# Patient Record
Sex: Male | Born: 1990 | Race: Black or African American | Hispanic: No | Marital: Married | Smoking: Never smoker
Health system: Southern US, Community
[De-identification: ages and names within clinical notes are randomized; demographics above are authoritative.]

---

## 2017-03-24 ENCOUNTER — Ambulatory Visit
Admission: RE | Admit: 2017-03-24 | Discharge: 2017-03-24 | Disposition: A | Discharge status: Home / Self Care | Payer: No Typology Code available for payment source | Source: Ambulatory Visit | Attending: Internal Medicine | Admitting: Internal Medicine

## 2017-03-24 ENCOUNTER — Other Ambulatory Visit: Payer: Self-pay | Admitting: Internal Medicine

## 2017-03-24 DIAGNOSIS — Z0289 Encounter for other administrative examinations: Secondary | ICD-10-CM

## 2017-11-05 ENCOUNTER — Ambulatory Visit (HOSPITAL_COMMUNITY)
Admission: EM | Admit: 2017-11-05 | Discharge: 2017-11-05 | Disposition: A | Discharge status: Home / Self Care | Payer: Self-pay

## 2017-11-05 ENCOUNTER — Other Ambulatory Visit: Payer: Self-pay

## 2017-11-05 ENCOUNTER — Encounter (HOSPITAL_COMMUNITY): Payer: Self-pay | Admitting: Emergency Medicine

## 2017-11-05 DIAGNOSIS — K625 Hemorrhage of anus and rectum: Secondary | ICD-10-CM

## 2017-11-05 NOTE — Discharge Instructions (Addendum)
Common causes of bleeding from the rectum include hemorrhoids or anal fissure- neither of these were seen on exam today.   Occasional bleeding with wiping is okay, as long as it isn't persistent or seen in the stool.   I would continue to increase fiber in your diet along with fluid intake in order to stay regular and so you do not have to strain.   You may try a sitz bath- this is typically for hemorrhoids or anal fissures.  Please return if bleeding occurring more frequently, develop pain, dizziness, light headedness, increase in amount of blood.

## 2017-11-05 NOTE — ED Triage Notes (Signed)
Pt reports rectal bleeding and abdominal pain three times in the last three months.  He saw a doctor and had an xray done and they determined he was constipated at that time.  He states this happened again on Christmas Day. He only sees the blood when wiping.

## 2017-11-09 NOTE — ED Provider Notes (Signed)
[facility]    CSN: [id number] Arrival date & time: 11/05/17  1435     History   Chief Complaint Chief Complaint  Patient presents with  . Rectal Bleeding  . Abdominal Pain    HPI Julian Owens is a 26 y.o. male presenting with concern over occasional rectal bleeding. States this has occurred about 3 times in the past few months. Most recently on christmas. He states it only happened once and bleeding is only when wiping. Not mixed in stool. No pain. Denies history of familial colon cancer, polyps. Denies straining with bowels. [place] stool chart type 4. Recently was advised increasing fiber and laxative by his primary care, stopped doing this as he felt it didn't do much of anything.    HPI  History reviewed. No pertinent past medical history.  There are no active problems to display for this patient.   History reviewed. No pertinent surgical history.     Home Medications    Prior to Admission medications   Not on File    Family History History reviewed. No pertinent family history.  Social History Social History   Tobacco Use  . Smoking status: Never Smoker  . Smokeless tobacco: Never Used  Substance Use Topics  . Alcohol use: No    Frequency: Never  . Drug use: No     Allergies   Patient has no known allergies.   Review of Systems Review of Systems  Constitutional: Negative for activity change, appetite change, fatigue and fever.  Respiratory: Negative for shortness of breath.   Cardiovascular: Negative for chest pain.  Gastrointestinal: Positive for anal bleeding. Negative for abdominal pain, blood in stool, nausea, rectal pain and vomiting.  Musculoskeletal: Negative for myalgias.  Neurological: Negative for dizziness, syncope, weakness, light-headedness and headaches.     Physical Exam Triage Vital Signs ED Triage Vitals [11/05/17 1511]  Enc Vitals Group     BP 140/87     Pulse Rate 62     Resp      Temp 98 F (36.7  C)     Temp Source Oral     SpO2 100 %     Weight      Height      Head Circumference      Peak Flow      Pain Score      Pain Loc      Pain Edu?      Excl. in GC?    No data found.  Updated Vital Signs BP 140/87 (BP Location: Left Arm)   Pulse 62   Temp 98 F (36.7 C) (Oral)   SpO2 100%    Physical Exam  Constitutional: He appears well-developed and well-nourished.  HENT:  Head: Normocephalic and atraumatic.  Eyes: Conjunctivae are normal.  Neck: Neck supple.  Cardiovascular: Normal rate and regular rhythm.  No murmur heard. Pulmonary/Chest: Effort normal and breath sounds normal. No respiratory distress.  Abdominal: Soft. There is no tenderness.  Genitourinary: Rectum normal. Rectal exam shows no tenderness.  Genitourinary Comments: No external hemorrhoids or anal fissure visualized, no palpation of internal hemorrhoids.   Musculoskeletal: He exhibits no edema.  Neurological: He is alert.  Skin: Skin is warm and dry.  Psychiatric: He has a normal mood and affect.  Nursing note and vitals reviewed.    UC Treatments / Results  Labs (all labs ordered are listed, but only abnormal results are displayed) Labs Reviewed - No data to display  EKG  EKG  Interpretation None       Radiology No results found.  Procedures Procedures (including critical care time)  Medications Ordered in UC Medications - No data to display   Initial Impression / Assessment and Plan / UC Course  I have reviewed the triage vital signs and the nursing notes.  Pertinent labs & imaging results that were available during my care of the patient were reviewed by me and considered in my medical decision making (see chart for details).     Occasional bleeding unlikely from colorectal polyps/cancer given age and lack of family history. No source of bleeding visualized or felt on rectal exam. Lacking pain, unlikely external hemorrhoids or anal fissure. Advised to continue increasing  fiber and water intake. Advised if experiencing occasional bleeding, this is okay as long as it doesn't persist or he notices it in the bowels.   Discussed return precautions. Patient verbalized understanding and is agreeable with plan.   Final Clinical Impressions(s) / UC Diagnoses   Final diagnoses:  Rectal bleeding    ED Discharge Orders    None       Controlled Substance Prescriptions [place] Controlled Substance Registry consulted? Not Applicable   Lew DawesWieters, Hallie C, [place] 11/09/17 1003

## 2019-06-22 ENCOUNTER — Emergency Department (HOSPITAL_BASED_OUTPATIENT_CLINIC_OR_DEPARTMENT_OTHER): Payer: Self-pay

## 2019-06-22 ENCOUNTER — Encounter (HOSPITAL_BASED_OUTPATIENT_CLINIC_OR_DEPARTMENT_OTHER): Payer: Self-pay

## 2019-06-22 ENCOUNTER — Other Ambulatory Visit: Payer: Self-pay

## 2019-06-22 ENCOUNTER — Emergency Department (HOSPITAL_BASED_OUTPATIENT_CLINIC_OR_DEPARTMENT_OTHER)
Admission: EM | Admit: 2019-06-22 | Discharge: 2019-06-22 | Disposition: A | Discharge status: Home / Self Care | Payer: Self-pay | Attending: Emergency Medicine | Admitting: Emergency Medicine

## 2019-06-22 DIAGNOSIS — R202 Paresthesia of skin: Secondary | ICD-10-CM | POA: Insufficient documentation

## 2019-06-22 DIAGNOSIS — G43909 Migraine, unspecified, not intractable, without status migrainosus: Secondary | ICD-10-CM | POA: Insufficient documentation

## 2019-06-22 LAB — CBC WITH DIFFERENTIAL/PLATELET
Abs Immature Granulocytes: 0.02 10*3/uL (ref 0.00–0.07)
Basophils Absolute: 0 10*3/uL (ref 0.0–0.1)
Basophils Relative: 0 %
Eosinophils Absolute: 0.1 10*3/uL (ref 0.0–0.5)
Eosinophils Relative: 1 %
HCT: 45.9 % (ref 39.0–52.0)
Hemoglobin: 14.6 g/dL (ref 13.0–17.0)
Immature Granulocytes: 0 %
Lymphocytes Relative: 41 %
Lymphs Abs: 3.1 10*3/uL (ref 0.7–4.0)
MCH: 28.9 pg (ref 26.0–34.0)
MCHC: 31.8 g/dL (ref 30.0–36.0)
MCV: 90.7 fL (ref 80.0–100.0)
Monocytes Absolute: 0.8 10*3/uL (ref 0.1–1.0)
Monocytes Relative: 10 %
Neutro Abs: 3.6 10*3/uL (ref 1.7–7.7)
Neutrophils Relative %: 48 %
Platelets: 254 10*3/uL (ref 150–400)
RBC: 5.06 MIL/uL (ref 4.22–5.81)
RDW: 11.9 % (ref 11.5–15.5)
WBC: 7.6 10*3/uL (ref 4.0–10.5)
nRBC: 0 % (ref 0.0–0.2)

## 2019-06-22 LAB — BASIC METABOLIC PANEL
Anion gap: 9 (ref 5–15)
BUN: 15 mg/dL (ref 6–20)
CO2: 27 mmol/L (ref 22–32)
Calcium: 8.9 mg/dL (ref 8.9–10.3)
Chloride: 101 mmol/L (ref 98–111)
Creatinine, Ser: 0.83 mg/dL (ref 0.61–1.24)
GFR calc Af Amer: >60 mL/min (ref >60)
GFR calc non Af Amer: >60 mL/min (ref >60)
Glucose, Bld: 101 mg/dL — ABNORMAL HIGH (ref 70–99)
Potassium: 4.4 mmol/L (ref 3.5–5.1)
Sodium: 137 mmol/L (ref 135–145)

## 2019-06-22 MED ORDER — METOCLOPRAMIDE HCL 5 MG/ML IJ SOLN
10.0000 mg | Freq: Once | INTRAMUSCULAR | Status: AC
  Administered 2019-06-22: 10 mg via INTRAVENOUS
  Filled 2019-06-22: qty 2

## 2019-06-22 MED ORDER — SODIUM CHLORIDE 0.9 % IV SOLN: INTRAVENOUS | Status: DC

## 2019-06-22 MED ORDER — SODIUM CHLORIDE 0.9 % IV BOLUS
1000.0000 mL | Freq: Once | INTRAVENOUS | Status: AC
  Administered 2019-06-22: 1000 mL via INTRAVENOUS

## 2019-06-22 MED ORDER — DEXAMETHASONE SODIUM PHOSPHATE 10 MG/ML IJ SOLN
10.0000 mg | Freq: Once | INTRAMUSCULAR | Status: AC
  Administered 2019-06-22: 21:00:00 10 mg via INTRAVENOUS
  Filled 2019-06-22: qty 1

## 2019-06-22 MED ORDER — DIPHENHYDRAMINE HCL 50 MG/ML IJ SOLN
25.0000 mg | Freq: Once | INTRAMUSCULAR | Status: AC
  Administered 2019-06-22: 21:00:00 25 mg via INTRAVENOUS
  Filled 2019-06-22: qty 1

## 2019-06-22 NOTE — ED Notes (Signed)
ED Provider at bedside. 

## 2019-06-22 NOTE — Discharge Instructions (Signed)
Work-up in symptoms seem to be consistent with migraine headache.  Follow-up with low [facility] neurology.  Follow-up with your wife's primary care doctor if they are able to add you on.  Rest tomorrow.  Work note provided.  Head CT today was negative.  Return for any new or worse symptoms.

## 2019-06-22 NOTE — ED Triage Notes (Signed)
Pt c/o HA that started 1 hour PTA. Pt has associated blurred vision and lightheadedness. Pt states he had a brief period of tingling on his L side, but that has resolved.

## 2019-06-22 NOTE — ED Provider Notes (Signed)
[facility] EMERGENCY DEPARTMENT Provider Note   CSN: [id number] Arrival date & time: 06/22/19  1853     History   Chief Complaint Chief Complaint  Patient presents with  . Headache    HPI Julian Owens is a 28 y.o. male.     Patient at 1730 with acute onset of a frontal headache some bilateral blurred vision and some left-sided numbness.  Patient still has the headache but the numbness and blurred vision has resolved.  Patient had something similar happen about a year ago.  Patient thinks it is due to high blood pressure.  No family history of no known history of migraines.  The pain is in the frontal part of the head and that is where it was a year ago.  Patient also a year ago did have a visual changes.  And did have some the numbness.  Is also had this happen prior to that as well.  But no formal diagnosis of migraines.  No fevers no chest pain no shortness of breath no upper respiratory infection symptoms.  Patient was at work when this occurred.     History reviewed. No pertinent past medical history.  There are no active problems to display for this patient.   History reviewed. No pertinent surgical history.      Home Medications    Prior to Admission medications   Not on File    Family History No family history on file.  Social History Social History   Tobacco Use  . Smoking status: Never Smoker  . Smokeless tobacco: Never Used  Substance Use Topics  . Alcohol use: No    Frequency: Never  . Drug use: No     Allergies   Patient has no known allergies.   Review of Systems Review of Systems  Constitutional: Negative for chills and fever.  HENT: Negative for congestion, rhinorrhea and sore throat.   Eyes: Positive for visual disturbance.  Respiratory: Negative for cough and shortness of breath.   Cardiovascular: Negative for chest pain and leg swelling.  Gastrointestinal: Negative for abdominal pain, diarrhea, nausea and vomiting.   Genitourinary: Negative for dysuria.  Musculoskeletal: Negative for back pain and neck pain.  Skin: Negative for rash.  Neurological: Positive for numbness and headaches. Negative for dizziness, weakness and light-headedness.  Hematological: Does not bruise/bleed easily.  Psychiatric/Behavioral: Negative for confusion.     Physical Exam Updated Vital Signs BP 113/80   Pulse (!) 57   Temp 98.8 F (37.1 C) (Oral)   Resp 19   Ht 1.905 m (6\' 3" )   Wt 90.7 kg   SpO2 99%   BMI 25.00 kg/m   Physical Exam Vitals signs and nursing note reviewed.  Constitutional:      Appearance: Normal appearance. He is well-developed. He is not toxic-appearing.  HENT:     Head: Normocephalic and atraumatic.  Eyes:     Extraocular Movements: Extraocular movements intact.     Conjunctiva/sclera: Conjunctivae normal.     Pupils: Pupils are equal, round, and reactive to light.  Neck:     Musculoskeletal: Normal range of motion and neck supple. No neck rigidity or muscular tenderness.  Cardiovascular:     Rate and Rhythm: Normal rate and regular rhythm.     Heart sounds: No murmur.  Pulmonary:     Effort: Pulmonary effort is normal. No respiratory distress.     Breath sounds: Normal breath sounds.  Abdominal:     Palpations: Abdomen is soft.  Tenderness: There is no abdominal tenderness.  Musculoskeletal: Normal range of motion.  Skin:    General: Skin is warm and dry.  Neurological:     General: No focal deficit present.     Mental Status: He is alert and oriented to person, place, and time.     Cranial Nerves: No cranial nerve deficit.     Sensory: No sensory deficit.     Motor: No weakness.      ED Treatments / Results  Labs (all labs ordered are listed, but only abnormal results are displayed) Labs Reviewed  BASIC METABOLIC PANEL - Abnormal; Notable for the following components:      Result Value   Glucose, Bld 101 (*)    All other components within normal limits  CBC WITH  DIFFERENTIAL/PLATELET    EKG EKG Interpretation  Date/Time:  Wednesday June 22 2019 21:18:52 EDT Ventricular Rate:  53 PR Interval:    QRS Duration: 105 QT Interval:  405 QTC Calculation: 381 R Axis:   -12 Text Interpretation:  Sinus rhythm Early repolarization No previous ECGs available Confirmed by Fredia Sorrow [phone]) on 06/22/2019 9:24:10 PM   Radiology Ct Head Wo Contrast  Result Date: 06/22/2019 CLINICAL DATA:  Headache EXAM: CT HEAD WITHOUT CONTRAST TECHNIQUE: Contiguous axial images were obtained from the base of the skull through the vertex without intravenous contrast. COMPARISON:  None. FINDINGS: Brain: No evidence of acute infarction, hemorrhage, hydrocephalus, extra-axial collection or mass lesion/mass effect. Vascular: No hyperdense vessel or unexpected calcification. Skull: Normal. Negative for fracture or focal lesion. Sinuses/Orbits: The visualized paranasal sinuses are essentially clear. The mastoid air cells are unopacified. Other: None. IMPRESSION: Normal head CT. Electronically Signed   By: Julian Hy M.D.   On: 06/22/2019 21:20    Procedures Procedures (including critical care time)  Medications Ordered in ED Medications  0.9 %  sodium chloride infusion (has no administration in time range)  sodium chloride 0.9 % bolus 1,000 mL ( Intravenous Stopped 06/22/19 2226)  dexamethasone (DECADRON) injection 10 mg (10 mg Intravenous Given 06/22/19 2126)  diphenhydrAMINE (BENADRYL) injection 25 mg (25 mg Intravenous Given 06/22/19 2122)  metoCLOPramide (REGLAN) injection 10 mg (10 mg Intravenous Given 06/22/19 2124)     Initial Impression / Assessment and Plan / ED Course  I have reviewed the triage vital signs and the nursing notes.  Pertinent labs & imaging results that were available during my care of the patient were reviewed by me and considered in my medical decision making (see chart for details).       [place] patient symptoms very suggestive of  migraine since is been a recurrent thing last episode was a year ago.  And the pattern is been identical.  With the frontal headache pain blurred vision and numbness.  Head CT negative.  Patient treated with migraine cocktail to include Decadron Benadryl and Reglan.  Patient improved significantly with fluids and these treatments.  I feel that symptoms are consistent with migraine.  With a migraine or the headache improved patient's blood pressure also went back to normal.  Will be referred to neurology.  And also will probably follow-up with his wife's primary care doctor.  Work note provided.    Final Clinical Impressions(s) / ED Diagnoses   Final diagnoses:  Migraine without status migrainosus, not intractable, unspecified migraine type    ED Discharge Orders    None       Fredia Sorrow, MD 06/23/19 [id number]

## 2019-06-22 NOTE — ED Notes (Signed)
ED Provider at bedside. PT to be discharged

## 2019-06-22 NOTE — ED Notes (Signed)
PT wanted to leave AMA. Informed MD. When I went to have pt sign AMA he changed his mind and said he would stay.

## 2019-08-23 ENCOUNTER — Emergency Department (HOSPITAL_BASED_OUTPATIENT_CLINIC_OR_DEPARTMENT_OTHER)
Admission: EM | Admit: 2019-08-23 | Discharge: 2019-08-24 | Disposition: A | Discharge status: Home / Self Care | Payer: Self-pay | Attending: Emergency Medicine | Admitting: Emergency Medicine

## 2019-08-23 ENCOUNTER — Other Ambulatory Visit: Payer: Self-pay

## 2019-08-23 ENCOUNTER — Encounter (HOSPITAL_BASED_OUTPATIENT_CLINIC_OR_DEPARTMENT_OTHER): Payer: Self-pay | Admitting: *Deleted

## 2019-08-23 DIAGNOSIS — G510 Bell's palsy: Secondary | ICD-10-CM | POA: Insufficient documentation

## 2019-08-23 MED ORDER — PREDNISONE 50 MG PO TABS
60.0000 mg | ORAL_TABLET | Freq: Once | ORAL | Status: AC
  Administered 2019-08-24: 60 mg via ORAL
  Filled 2019-08-23: qty 1

## 2019-08-23 NOTE — ED Triage Notes (Addendum)
Pt c/o facial droop starting at 5 pm to day , h/a all day. Lost of taste this pm .

## 2019-08-23 NOTE — ED Notes (Signed)
ED Provider at bedside. 

## 2019-08-24 ENCOUNTER — Other Ambulatory Visit: Payer: Self-pay

## 2019-08-24 MED ORDER — HYPROMELLOSE (GONIOSCOPIC) 2.5 % OP SOLN: 1.0000 [drp] | Freq: Three times a day (TID) | OPHTHALMIC | 12 refills | Status: AC | PRN

## 2019-08-24 MED ORDER — PREDNISONE 50 MG PO TABS: ORAL_TABLET | ORAL | 0 refills | Status: AC

## 2019-08-24 NOTE — ED Provider Notes (Signed)
[facility] EM[facility]ENC[facility]PARTMENT Provider Note   CSN: [id number] Arrival date & time: 08/23/19  2345     History   Chief Complaint Chief Complaint  Patient presents with  . Facial Droop    HPI Julian Owens is a 28 y.o. male.     The history is provided by the patient and the spouse.  Neurologic Problem This is a new problem. The current episode started 6 to 12 hours ago. The problem occurs constantly. The problem has not changed since onset.Associated symptoms include headaches. Pertinent negatives include no chest pain, no abdominal pain and no shortness of breath. Nothing aggravates the symptoms. Nothing relieves the symptoms. He has tried nothing for the symptoms.  Patient reports that approximately 5 PM on October 13 he had right-sided facial weakness.  He reports loss of taste on that side.  Also reports mild weakness of the forehead.  He did have mild headache that is improving.  No arm or Leg weakness.  No speech difficulty No history of stroke.  Previous history of migraines. No history of hearing changes  PMH=migraines Home Medications    Prior to Admission medications   Medication Sig Start Date End Date Taking? Authorizing Provider  hydroxypropyl methylcellulose / hypromellose (ISOPTO TEARS / GONIOVISC) 2.5 % ophthalmic solution Place 1 drop into the right eye 3 (three) times daily as needed for dry eyes. 08/24/19   Zadie Rhine, MD  predniSONE (DELTASONE) 50 MG tablet 1 tablet PO QD X6 days 08/24/19   Zadie Rhine, MD    Family History History reviewed. No pertinent family history.  Social History Social History   Tobacco Use  . Smoking status: Never Smoker  . Smokeless tobacco: Never Used  Substance Use Topics  . Alcohol use: No    Frequency: Never  . Drug use: No     Allergies   Patient has no known allergies.   Review of Systems Review of Systems  Constitutional: Negative for fever.  HENT: Negative for hearing loss.         Change in taste  Eyes: Negative for visual disturbance.  Respiratory: Negative for shortness of breath.   Cardiovascular: Negative for chest pain.  Gastrointestinal: Negative for abdominal pain.  Neurological: Positive for facial asymmetry and headaches. Negative for dizziness, speech difficulty and numbness.  All other systems reviewed and are negative.    Physical Exam Updated Vital Signs BP (!) 144/95   Pulse 76   Temp 98.2 F (36.8 C)   Resp 16   Ht 1.905 m (6\' 3" )   Wt 99.8 kg   SpO2 99%   BMI 27.50 kg/m   Physical Exam  CONSTITUTIONAL: Well developed/well nourished HEAD: Normocephalic/atraumatic EYES: EOMI/PERRL, no nystagmus, no visual field deficit, pt with difficulty keeping right eyelid closed ENMT: Mucous membranes moist, bilateral TMs clear/intact, no rash NECK: supple no meningeal signs, no bruits CV: S1/S2 noted, no murmurs/rubs/gallops noted LUNGS: Lungs are clear to auscultation bilaterally, no apparent distress ABDOMEN: soft, nontender NEURO:Awake/alert, mild right facial droop, no arm or leg drift is noted Equal 5/5 strength with shoulder abduction, elbow flex/extension, wrist flex/extension in upper extremities and equal hand grips bilaterally Equal 5/5 strength with hip flexion,knee flex/extension, foot dorsi/plantar flexion Cranial nerves 3/4/5/04/18/09/11/12 tested and intact Gait normal without ataxia No past pointing Sensation to light touch intact in all extremities EXTREMITIES: pulses normal, full ROM SKIN: warm, color normal PSYCH: no abnormalities of mood noted  ED Treatments / Results  Labs (all labs ordered are  listed, but only abnormal results are displayed) Labs Reviewed - No data to display  EKG None  Radiology No results found.  Procedures Procedures   Medications Ordered in ED Medications  predniSONE (DELTASONE) tablet 60 mg (60 mg Oral Given 08/24/19 0006)     Initial Impression / Assessment and Plan / ED Course   I have reviewed the triage vital signs and the nursing notes.     Patient appears to have Bell's palsy due to right-sided facial weakness, loss of taste as well as difficulty keeping right eye closed with mild weakness of forehead He appears to have a mild case.  No arm or leg weakness.  No other signs of acute stroke. Reports his mild  headache is resolved. We will start steroids.  He was told not to wear his contacts.  Also encouraged use of artificial tears to keep right eye hydrated.  Follow-up with neurology in 2 weeks if no improvement.  We discussed strict return precautions  Final Clinical Impressions(s) / ED Diagnoses   Final diagnoses:  Bell's palsy    ED Discharge Orders         Ordered    predniSONE (DELTASONE) 50 MG tablet     08/24/19 0015    hydroxypropyl methylcellulose / hypromellose (ISOPTO TEARS / GONIOVISC) 2.5 % ophthalmic solution  3 times daily PRN     08/24/19 0015           Ripley Fraise, MD 08/24/19 0022

## 2020-05-25 ENCOUNTER — Encounter (INDEPENDENT_AMBULATORY_CARE_PROVIDER_SITE_OTHER): Payer: Self-pay | Admitting: Residents

## 2020-05-25 ENCOUNTER — Ambulatory Visit (INDEPENDENT_AMBULATORY_CARE_PROVIDER_SITE_OTHER): Payer: Commercial Managed Care - POS | Admitting: Residents

## 2020-05-25 VITALS — BP 128/90 | HR 56 | Temp 98.0°F | Resp 18 | Ht 74.0 in | Wt 329.0 lb

## 2020-05-25 DIAGNOSIS — Z0000 Encounter for general adult medical examination without abnormal findings: Secondary | ICD-10-CM

## 2020-05-25 DIAGNOSIS — K5904 Chronic idiopathic constipation: Secondary | ICD-10-CM | POA: Insufficient documentation

## 2020-05-25 DIAGNOSIS — Z23 Encounter for immunization: Secondary | ICD-10-CM

## 2020-05-25 NOTE — Progress Notes (Signed)
Patient discussed with David Tyler Murray, MD  concurrently with the patient's visit. Chart reviewed to include allergies, problems, current medications and family, social and past medical and surgical histories.  I reviewed the history, review of systems and physical exam as outlined in David Tyler Murray, MD's note and I agree with the findings as outlined in their note.  We discussed the assessment and plan for the encounter in detail and I concur with the assessment and plan as per David Tyler Murray, MD .

## 2020-05-25 NOTE — Progress Notes (Signed)
[facility] [place] [facility] [place] [facility]                       Date of Exam: 05/25/2020 11:47 AM        Patient ID: Joseph Edwards is a 29 y.o. male.  Attending Physician: Bertram Denver, MD        Chief Complaint:    Chief Complaint   Patient presents with    Annual Exam               HPI:       Patient is here for a wellness visit.    Moved from [place] in 2018:  Since then has had constipation, he thinks from change of food.  "Noticed blood with wiping" once 2 years ago, went to ED, negative work-up.  Bleeding stopped with fiber supplement.    Has gained weight since moving to the [place].    Never been to doctor before.    CARDIAC SCREENING:  Exercise: hasn't in a long time; no time with work  Diet- daily fast food/take out, home-cooked meals on weekends, "here and there" servings of fruits/vegetables  Tobacco- never  Alcohol- "can't remember last time drinking"  No family history of early heart disease    CANCER SCREENING:  ROS: Denies headaches, night sweats, fevers  No family history of breast or colon cancer    INFECTIOUS DISEASE:  Vaccines/immunizations: Was vaccinated fully prior to moving to the [place].  Patient is afraid of needles.  He agrees to [employer] vaccine today.  Recent Illness/Hospitalizations: none            Problem List:    Patient Active Problem List   Diagnosis    Chronic idiopathic constipation             Current Meds:    No outpatient medications have been marked as taking for the 05/25/20 encounter (Office Visit) with Bertram Denver, MD.          Allergies:    No Known Allergies          Past Surgical History:    History reviewed. No pertinent surgical history.        Family History:    History reviewed. No pertinent family history.        Social History:    Social History     Tobacco Use    Smoking status: Never Smoker    Smokeless tobacco: Never Used   Haematologist Use: Never used   Substance Use Topics    Alcohol use: Never    Drug use: Not on file           The following sections were reviewed this encounter by the provider:   Tobacco   Allergies   Meds   Problems   Med Hx   Surg Hx   Fam Hx              Vital Signs:    BP 128/90    Pulse (!) 56    Temp 98 F (36.7 C)    Resp 18    Ht 1.88 m (6\' 2" )    Wt 149.2 kg (329 lb)    BMI 42.24 kg/m          ROS:    Constitutional: no fever, no unintentional weight change  Eyes: no vision changes  ENT: no hearing changes, no difficulty swallowing  CV: no  chest pain, no palpitations  Resp: no unusual dyspnea  GI: no diarrhea, no constipation  GU: no dysuria, no hematuria  SKIN: no skin changes  Heme: no abnormal bleeding  Neuro: no focal weakness          Physical Exam:    GEN: well appearing, in no acute distress  Eyes: EOMI, PERRL  ENT: oropharynx clear, moist mucous membrane  Neck: no cervical LAD, thyroid normal without nodules  CV: regular rate and rhythm, no murmur or gallop, 2+ peripheral pulses  Pulm: normal respiratory effort on room air, clear to auscultation, no wheezing or rales  Abd: soft, non-distended, non-tender, no organomegaly  Rectal: Normal-appearing anus, no erythema of the surrounding tissue  Ext: no edema, normal ROM  Neuro: face symmetric, tongue midline, gait normal  Mental: normal mood and affect, normal speech, normal insight        Assessment:    1. High priority for COVID-19 virus vaccination  - COVID-19 AD26 vaccine 0.5 ML (JANSSEN)    2. Chronic idiopathic constipation            Plan:    Health Maintenance:  Recommend optimizing low carbohydrate diet efforts and obtaining at least 150 minutes of aerobic exercise per week.    Patient Instructions   We gave you your [employer] Covid vaccine today.    We discussed weight loss today.  We provided information for our clinics weight loss program.  Please call the phone number on the handout provided to schedule an intake appointment.  I recommend you continue try to get as much exercise as possible.    Regarding the itching and discomfort  in the perianal area, as we discussed the skin in this area is very sensitive and becomes more irritated as it is scratched or cleaned which leads to more itching.  I recommend good hygiene (with toilet paper only) when going to the bathroom and avoiding excessive cleaning or scratching of the area.  Try to itch as little as possible to break the itchscratch cycle.    Improving your diet to include vegetables and other items with fiber will improve your constipation.  You can also take an over-the-counter fiber supplement daily.                Follow-up:    Return in about 1 year (around 05/25/2021).         Bertram Denver, MD

## 2020-05-25 NOTE — Patient Instructions (Signed)
We gave you your [employer] Covid vaccine today.    We discussed weight loss today.  We provided information for our clinics weight loss program.  Please call the phone number on the handout provided to schedule an intake appointment.  I recommend you continue try to get as much exercise as possible.    Regarding the itching and discomfort in the perianal area, as we discussed the skin in this area is very sensitive and becomes more irritated as it is scratched or cleaned which leads to more itching.  I recommend good hygiene (with toilet paper only) when going to the bathroom and avoiding excessive cleaning or scratching of the area.  Try to itch as little as possible to break the itch-scratch cycle.    Improving your diet to include vegetables and other items with fiber will improve your constipation.  You can also take an over-the-counter fiber supplement daily.

## 2020-07-20 IMAGING — CT CT HEAD WITHOUT CONTRAST
3 series · 15 of 47 positions shown, 18 images · non-contrast
Comparison: None.

CLINICAL DATA: Headache

EXAM:
CT HEAD WITHOUT CONTRAST
TECHNIQUE: Contiguous axial images were obtained from the base of the skull
through the vertex without intravenous contrast.

[Series 2: head wo · axial · 0.49mm/px · z∈[+536,+666]mm · 9 of 32 slices shown, 12 images]
[im 3/32  brain]
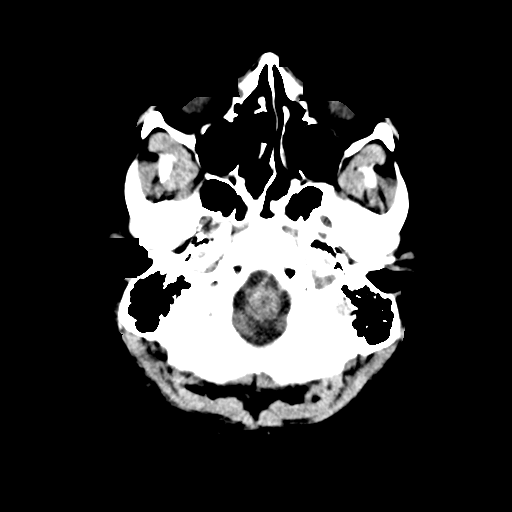
[im 3/32  bone]
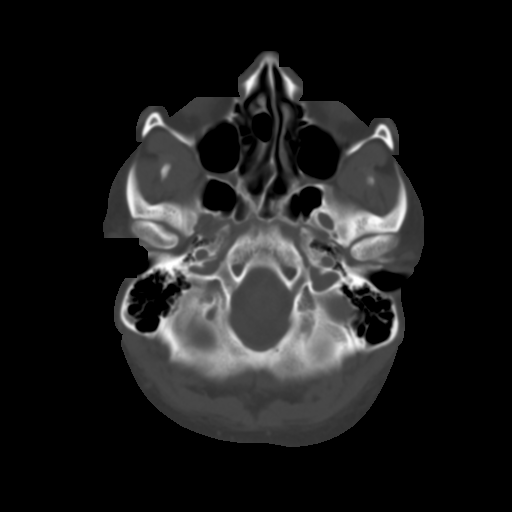
[im 6/32  brain]
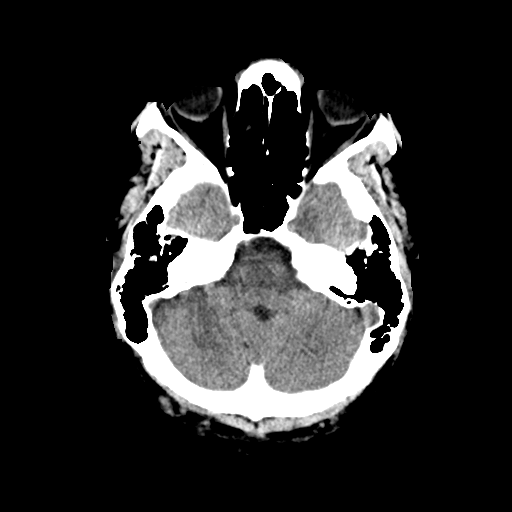
[im 9/32  brain]
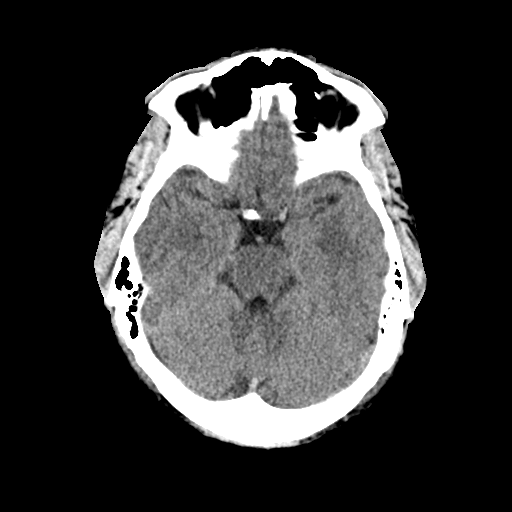
[im 12/32  brain]
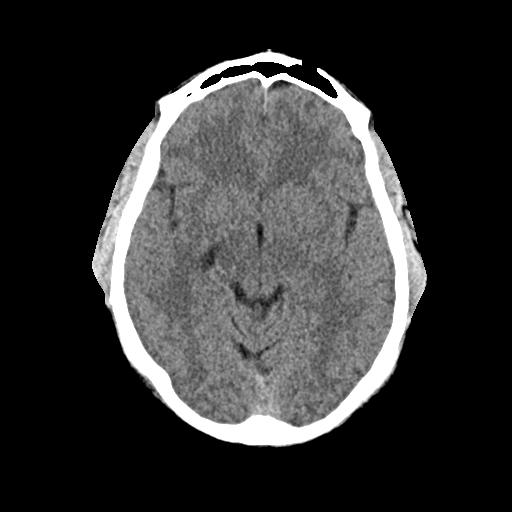
[im 17/32  brain]
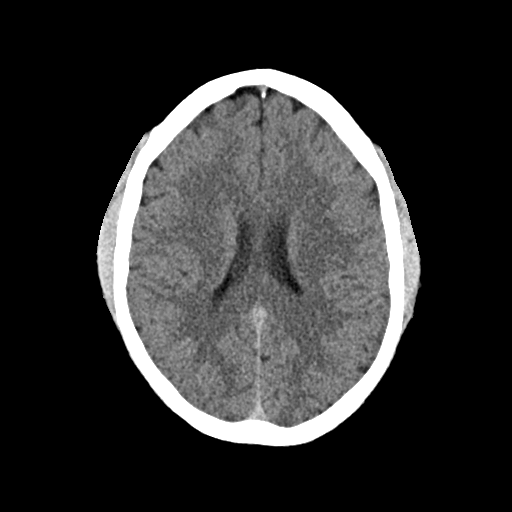
[im 17/32  bone]
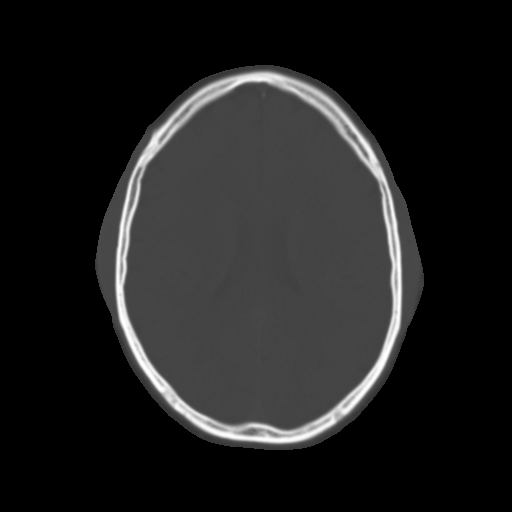
[im 20/32  brain]
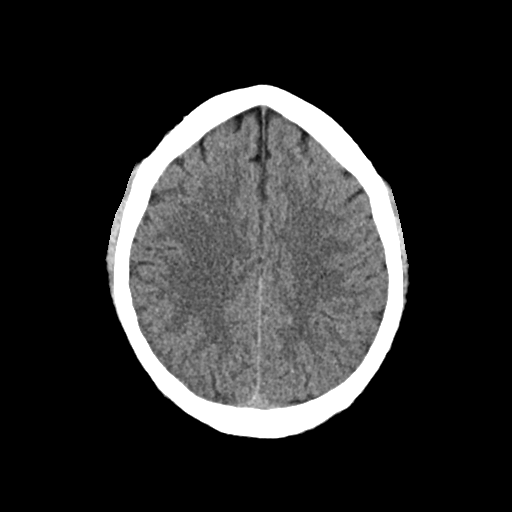
[im 23/32  brain]
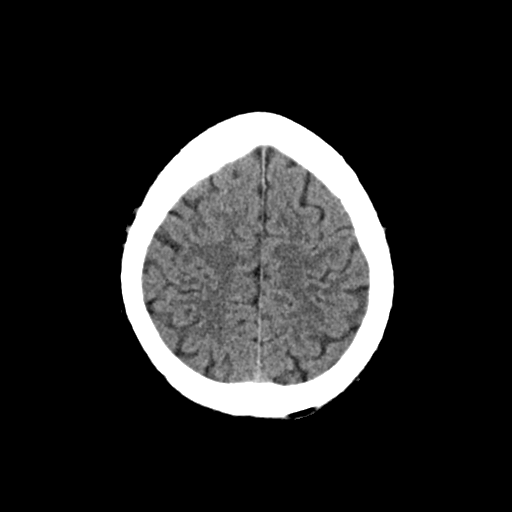
[im 26/32  brain]
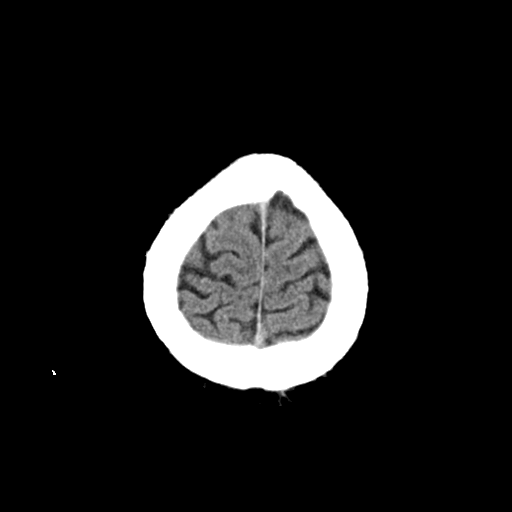
[im 29/32  brain]
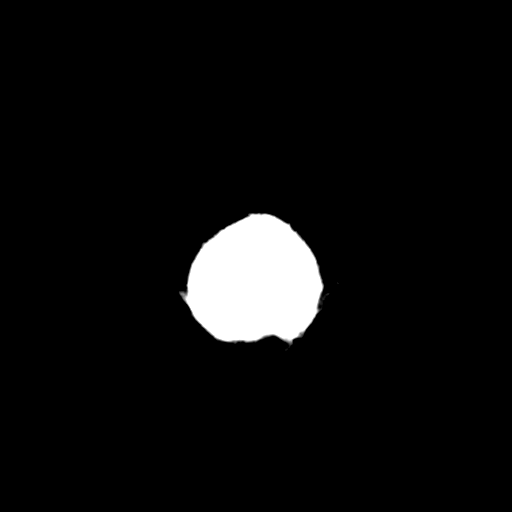
[im 29/32  bone]
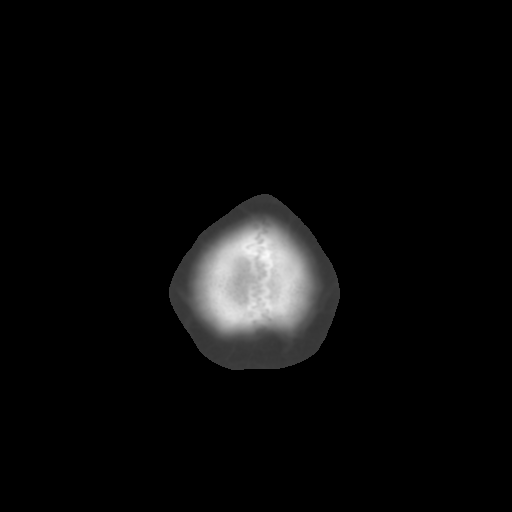

[Series 4: coronal soft · coronal · 0.33mm/px · 3 of 74 slices shown]
[im 25/74  brain]
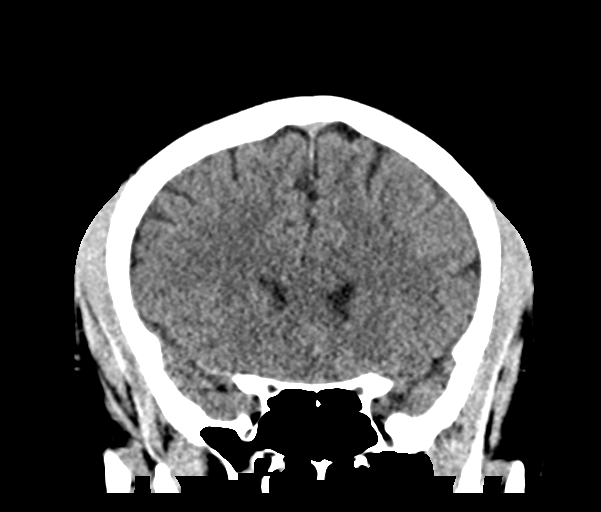
[im 33/74  brain]
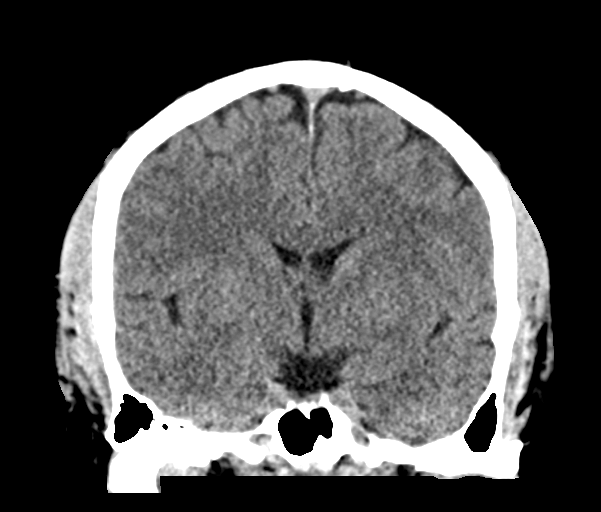
[im 41/74  brain]
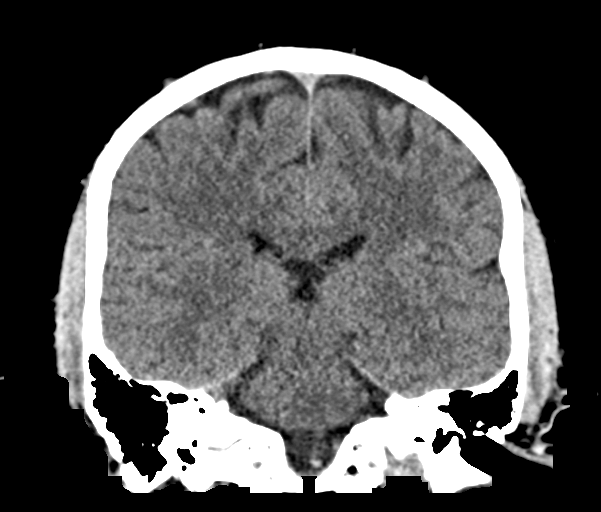

[Series 5: sag soft · sagittal · 0.30mm/px · 3 of 64 slices shown]
[im 22/64  brain]
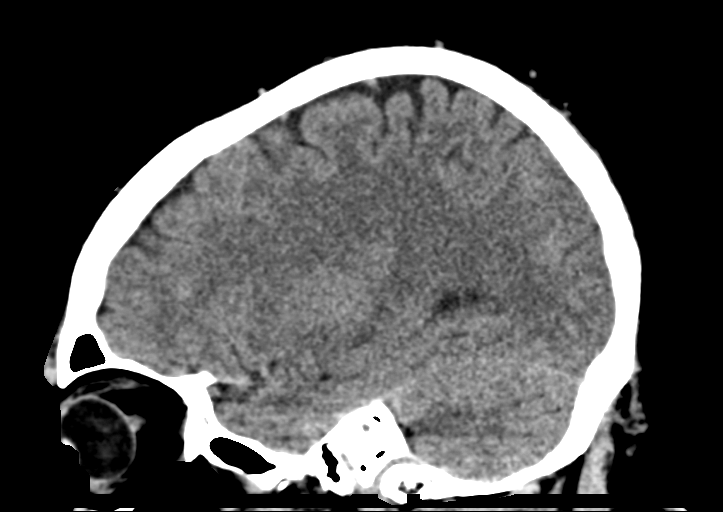
[im 32/64  brain]
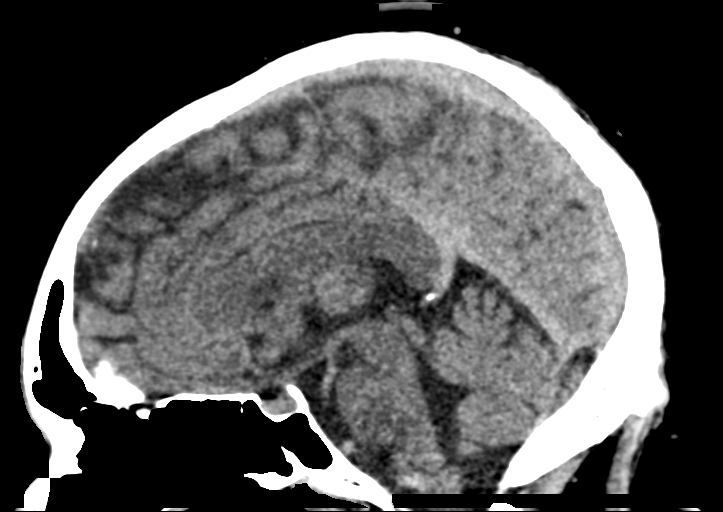
[im 43/64  brain]
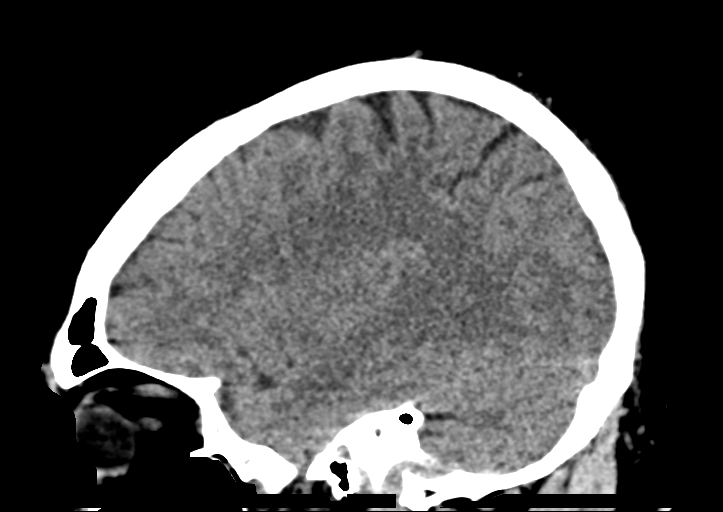

[15 of 47 positions shown; findings below may reference images not displayed]

FINDINGS: Brain: No evidence of acute infarction, hemorrhage, hydrocephalus,
extra-axial collection or mass lesion/mass effect.

Vascular: No hyperdense vessel or unexpected calcification.

Skull: Normal. Negative for fracture or focal lesion.

Sinuses/Orbits: The visualized paranasal sinuses are essentially
clear. The mastoid air cells are unopacified.

Other: None.
IMPRESSION: Normal head CT.

## 2021-04-10 ENCOUNTER — Ambulatory Visit (INDEPENDENT_AMBULATORY_CARE_PROVIDER_SITE_OTHER): Payer: Commercial Managed Care - POS | Admitting: Internal Medicine

## 2021-04-10 ENCOUNTER — Encounter (INDEPENDENT_AMBULATORY_CARE_PROVIDER_SITE_OTHER): Payer: Self-pay

## 2021-04-10 ENCOUNTER — Encounter (INDEPENDENT_AMBULATORY_CARE_PROVIDER_SITE_OTHER): Payer: Self-pay | Admitting: Internal Medicine

## 2021-04-10 VITALS — BP 124/85 | HR 87 | Temp 98.0°F | Resp 12 | Ht 73.0 in | Wt 343.0 lb

## 2021-04-10 DIAGNOSIS — M79672 Pain in left foot: Secondary | ICD-10-CM

## 2021-04-10 DIAGNOSIS — Z13220 Encounter for screening for lipoid disorders: Secondary | ICD-10-CM

## 2021-04-10 DIAGNOSIS — R682 Dry mouth, unspecified: Secondary | ICD-10-CM

## 2021-04-10 DIAGNOSIS — R635 Abnormal weight gain: Secondary | ICD-10-CM

## 2021-04-10 DIAGNOSIS — Z3141 Encounter for fertility testing: Secondary | ICD-10-CM

## 2021-04-10 DIAGNOSIS — Z131 Encounter for screening for diabetes mellitus: Secondary | ICD-10-CM

## 2021-04-10 DIAGNOSIS — G4719 Other hypersomnia: Secondary | ICD-10-CM

## 2021-04-10 DIAGNOSIS — Z6841 Body Mass Index (BMI) 40.0 and over, adult: Secondary | ICD-10-CM

## 2021-04-10 NOTE — Progress Notes (Signed)
Have you seen any specialists/other providers since your last visit with us?    Previous PCP    Arm preference verified?   yes    The patient is due for nothing at this time.

## 2021-04-10 NOTE — Progress Notes (Signed)
Subjective:       Patient ID: Joseph Edwards is a 30 y.o. male.    HPI  Pt is here to establish care and c/o    Dry mouth in AM x unclear duration,   +snoring  No AM headache,   +Daytime sleepiness,   +weight gain around 100lbs over 2 yrs.     Left foot pain x several months, with standing up from sitting, pain improves after walking a while.   No injury    Unsuccessful with spouse getting pregnant , trying > 1 yr.       The following portions of the patient's history were reviewed and updated as appropriate: allergies, current medications, past family history, past medical history, past social history, past surgical history and problem list.    Review of Systems   Constitutional: Positive for unexpected weight change. Negative for appetite change, chills and fever.   HENT: Positive for congestion.         Nasal congestion   Respiratory: Negative for shortness of breath.    Cardiovascular: Negative for chest pain and palpitations.   Gastrointestinal: Negative for abdominal pain, nausea and vomiting.   Genitourinary: Negative for dysuria.   Musculoskeletal: Negative for joint swelling and myalgias.   Neurological: Negative for syncope, light-headedness and headaches.     BP 124/85 (BP Site: Left arm, Patient Position: Sitting, Cuff Size: Medium)   Pulse 87   Temp 98 F (36.7 C)   Resp 12   Ht 1.854 m (6\' 1" )   Wt 155.6 kg (343 lb)   BMI 45.25 kg/m        Objective:    Physical Exam  Constitutional:       General: He is not in acute distress.     Appearance: He is well-developed. He is obese.   HENT:      Mouth/Throat:      Pharynx: No oropharyngeal exudate.   Eyes:      General: No scleral icterus.     Conjunctiva/sclera: Conjunctivae normal.   Neck:      Thyroid: No thyromegaly.      Vascular: No carotid bruit or JVD.   Cardiovascular:      Rate and Rhythm: Normal rate and regular rhythm.      Heart sounds: Normal heart sounds. No murmur heard.    No friction rub. No gallop.   Pulmonary:      Effort: Pulmonary  effort is normal. No respiratory distress.      Breath sounds: Normal breath sounds. No rales.   Abdominal:      General: Bowel sounds are normal. There is no distension.      Palpations: Abdomen is soft. There is no mass.      Tenderness: There is no abdominal tenderness. There is no guarding or rebound.   Musculoskeletal:         General: No tenderness.      Cervical back: Neck supple.      Right lower leg: No edema.      Left lower leg: No edema.      Comments: Left foot non tender, no swelling, no redness.    Neurological:      Mental Status: He is alert.             Assessment:       1. Excessive daytime sleepiness  Ambulatory referral to Pulmonology    CBC and differential    Comprehensive metabolic panel   2. Weight gain  CBC and differential    Comprehensive metabolic panel    TSH    Bariatric Surgery Referral: Marshia Ly, FNP-BC [facility])   3. Fertility testing  Semen Analysis, Basic   4. Diabetes mellitus screening  Hemoglobin A1C   5. Screening cholesterol level  Lipid panel   6. Left foot pain  XR Foot Left AP And Lateral    Ambulatory referral to Podiatry   7. Class 3 severe obesity with serious comorbidity and body mass index (BMI) of 45.0 to 49.9 in adult, unspecified obesity type  Bariatric Surgery Referral: Marshia Ly, FNP-BC [facility])   8. Dry mouth  Anti-SSA (Ro)(Ena) Antibody    Anti-SSB (La)(Ena) Antibody          Plan:      Procedures  No orders of the defined types were placed in this encounter.      Labs ordered  Xray left foot ordered  otc flonase,   Avoid anti histamines  Referred pt to pulm/sleep medicine for evaluation  Referred pt to bariatrics for weight management  Referred pt to podiatrist for foot evaluation   Low carb, low chol diet, weight reduction  Semen analysis ordered  Further recommendations pending test results.  F/u 3 months or sooner prn

## 2021-05-27 ENCOUNTER — Ambulatory Visit (INDEPENDENT_AMBULATORY_CARE_PROVIDER_SITE_OTHER): Payer: Commercial Managed Care - POS | Admitting: Internal Medicine

## 2021-05-27 ENCOUNTER — Encounter (INDEPENDENT_AMBULATORY_CARE_PROVIDER_SITE_OTHER): Payer: Self-pay | Admitting: Internal Medicine

## 2021-05-27 VITALS — BP 139/90 | HR 71 | Temp 97.7°F | Resp 20 | Ht 74.0 in | Wt 340.2 lb

## 2021-05-27 DIAGNOSIS — Z23 Encounter for immunization: Secondary | ICD-10-CM

## 2021-05-27 DIAGNOSIS — Z6841 Body Mass Index (BMI) 40.0 and over, adult: Secondary | ICD-10-CM

## 2021-05-27 DIAGNOSIS — Z0000 Encounter for general adult medical examination without abnormal findings: Secondary | ICD-10-CM

## 2021-05-27 DIAGNOSIS — G4719 Other hypersomnia: Secondary | ICD-10-CM

## 2021-05-27 DIAGNOSIS — R635 Abnormal weight gain: Secondary | ICD-10-CM

## 2021-05-27 DIAGNOSIS — Z13220 Encounter for screening for lipoid disorders: Secondary | ICD-10-CM

## 2021-05-27 DIAGNOSIS — R682 Dry mouth, unspecified: Secondary | ICD-10-CM

## 2021-05-27 DIAGNOSIS — Z131 Encounter for screening for diabetes mellitus: Secondary | ICD-10-CM

## 2021-05-27 DIAGNOSIS — F419 Anxiety disorder, unspecified: Secondary | ICD-10-CM

## 2021-05-27 LAB — CBC AND DIFFERENTIAL
Absolute NRBC: 0.03 10*3/uL — ABNORMAL HIGH (ref 0.00–0.00)
Basophils Absolute Automated: 0.04 10*3/uL (ref 0.00–0.08)
Basophils Automated: 0.6 %
Eosinophils Absolute Automated: 0.11 10*3/uL (ref 0.00–0.44)
Eosinophils Automated: 1.7 %
Hematocrit: 46.6 % (ref 37.6–49.6)
Hgb: 14.8 g/dL (ref 12.5–17.1)
Immature Granulocytes Absolute: 0.02 10*3/uL (ref 0.00–0.07)
Immature Granulocytes: 0.3 %
Lymphocytes Absolute Automated: 3.58 10*3/uL — ABNORMAL HIGH (ref 0.42–3.22)
Lymphocytes Automated: 54.5 %
MCH: 28.6 pg (ref 25.1–33.5)
MCHC: 31.8 g/dL (ref 31.5–35.8)
MCV: 90 fL (ref 78.0–96.0)
MPV: 9.6 fL (ref 8.9–12.5)
Monocytes Absolute Automated: 0.59 10*3/uL (ref 0.21–0.85)
Monocytes: 9 %
Neutrophils Absolute: 2.23 10*3/uL (ref 1.10–6.33)
Neutrophils: 33.9 %
Nucleated RBC: 0.5 /100 WBC — ABNORMAL HIGH (ref 0.0–0.0)
Platelets: 290 10*3/uL (ref 142–346)
RBC: 5.18 10*6/uL (ref 4.20–5.90)
RDW: 12 % (ref 11–15)
WBC: 6.57 10*3/uL (ref 3.10–9.50)

## 2021-05-27 LAB — HEMOGLOBIN A1C
Average Estimated Glucose: 125.5 mg/dL
Hemoglobin A1C: 6 % — ABNORMAL HIGH (ref 4.6–5.9)

## 2021-05-27 LAB — COMPREHENSIVE METABOLIC PANEL
ALT: 79 U/L — ABNORMAL HIGH (ref 0–55)
AST (SGOT): 39 U/L — ABNORMAL HIGH (ref 5–34)
Albumin/Globulin Ratio: 1.4 (ref 0.9–2.2)
Albumin: 4.3 g/dL (ref 3.5–5.0)
Alkaline Phosphatase: 65 U/L (ref 37–117)
Anion Gap: 6 (ref 5.0–15.0)
BUN: 14 mg/dL (ref 9.0–28.0)
Bilirubin, Total: 0.9 mg/dL (ref 0.2–1.2)
CO2: 29 mEq/L (ref 21–29)
Calcium: 9.5 mg/dL (ref 8.5–10.5)
Chloride: 101 mEq/L (ref 100–111)
Creatinine: 1.1 mg/dL (ref 0.5–1.5)
Globulin: 3.1 g/dL (ref 2.0–3.6)
Glucose: 102 mg/dL — ABNORMAL HIGH (ref 70–100)
Potassium: 4.3 mEq/L (ref 3.5–5.1)
Protein, Total: 7.4 g/dL (ref 6.0–8.3)
Sodium: 136 mEq/L (ref 136–145)

## 2021-05-27 LAB — LIPID PANEL
Cholesterol / HDL Ratio: 4.3 Index
Cholesterol: 176 mg/dL (ref 0–199)
HDL: 41 mg/dL (ref 40–9999)
LDL Calculated: 121 mg/dL — ABNORMAL HIGH (ref 0–99)
Triglycerides: 68 mg/dL (ref 34–149)
VLDL Calculated: 14 mg/dL (ref 10–40)

## 2021-05-27 LAB — HEMOLYSIS INDEX: Hemolysis Index: 3 Index (ref 0–24)

## 2021-05-27 LAB — GFR: EGFR: >60

## 2021-05-27 NOTE — Progress Notes (Signed)
Subjective:      Patient ID: Joseph Edwards is a 30 y.o. male.    Chief Complaint:  Chief Complaint   Patient presents with    Annual Exam     The patient is fasting        HPI:  HPI  pt is here for annual physical exam     Exercise, limited.   Diet, regular    Problem List:  Patient Active Problem List   Diagnosis    Chronic idiopathic constipation       Current Medications:  No current outpatient medications on file.     No current facility-administered medications for this visit.       Allergies:  No Known Allergies    Past Medical History:  History reviewed. No pertinent past medical history.    Past Surgical History:  History reviewed. No pertinent surgical history.    Family History:  Family History   Problem Relation Age of Onset    Diabetes Mother     No known problems Father        Social History:  Social History     Socioeconomic History    Marital status: Married    Number of children: 0   Occupational History    Occupation: Marketing executive company   Tobacco Use    Smoking status: Never    Smokeless tobacco: Never   Vaping Use    Vaping Use: Never used   Substance and Sexual Activity    Alcohol use: Yes     Comment: socially    Drug use: Not Currently    Sexual activity: Yes     Partners: Female     Social Determinants of Health     Financial Resource Strain: Low Risk     Difficulty of Paying Living Expenses: Not hard at all   Food Insecurity: No Food Insecurity    Worried About Programme researcher, broadcasting/film/video in the Last Year: Never true    Barista in the Last Year: Never true   Transportation Needs: No Transportation Needs    Lack of Transportation (Medical): No    Lack of Transportation (Non-Medical): No   Physical Activity: Inactive    Days of Exercise per Week: 0 days    Minutes of Exercise per Session: 0 min   Stress: Stress Concern Present    Feeling of Stress : To some extent   Social Connections: Moderately Integrated    Frequency of Communication with Friends and Family: More than three times a week     Frequency of Social Gatherings with Friends and Family: Three times a week    Attends Religious Services: More than 4 times per year    Active Member of Clubs or Organizations: No    Attends Banker Meetings: Never    Marital Status: Married   Catering manager Violence: Not At Risk    Fear of Current or Ex-Partner: No    Emotionally Abused: No    Physically Abused: No    Sexually Abused: No   Housing Stability: Low Risk     Unable to Pay for Housing in the Last Year: No    Number of Places Lived in the Last Year: 2    Unstable Housing in the Last Year: No        The following sections were reviewed this encounter by the provider:   Allergies   Meds   Problems   Med Hx   Surg  Hx            ROS:  Review of Systems   Constitutional:  Negative for appetite change, chills, fever and unexpected weight change.   HENT:  Negative for hearing loss.    Eyes:  Negative for visual disturbance.        +glasses, vision good, last eye exam 2021   Respiratory:  Negative for shortness of breath and wheezing.    Cardiovascular:  Negative for chest pain and palpitations.   Gastrointestinal:  Negative for abdominal pain, constipation, diarrhea, nausea and vomiting.   Genitourinary:  Negative for dysuria, frequency, hematuria and urgency.   Musculoskeletal:  Negative for arthralgias and joint swelling.   Skin:  Negative for rash.   Neurological:  Negative for syncope, weakness, light-headedness and numbness.   Hematological:  Negative for adenopathy.   Psychiatric/Behavioral:  Positive for sleep disturbance. Negative for dysphoric mood. The patient is nervous/anxious.      Vitals:  BP 139/90 (BP Site: Right arm, Patient Position: Sitting, Cuff Size: Large)    Pulse 71    Temp 97.7 F (36.5 C) (Temporal)    Resp 20    Ht 1.88 m (6\' 2" )    Wt 154.3 kg (340 lb 3.2 oz)    BMI 43.68 kg/m      Objective:     Physical Exam:  Physical Exam  Constitutional:       General: He is not in acute distress.     Appearance: He is  well-developed.   HENT:      Head: Normocephalic and atraumatic.      Right Ear: External ear normal.      Left Ear: External ear normal.      Mouth/Throat:      Pharynx: No oropharyngeal exudate.   Eyes:      General: No scleral icterus.     Conjunctiva/sclera: Conjunctivae normal.      Pupils: Pupils are equal, round, and reactive to light.   Neck:      Thyroid: No thyromegaly.      Vascular: No carotid bruit or JVD.   Cardiovascular:      Rate and Rhythm: Normal rate and regular rhythm.      Heart sounds: Normal heart sounds. No murmur heard.    No friction rub. No gallop.   Pulmonary:      Effort: Pulmonary effort is normal. No respiratory distress.      Breath sounds: Normal breath sounds. No wheezing or rales.   Abdominal:      General: Bowel sounds are normal. There is no distension.      Palpations: Abdomen is soft. There is no mass.      Tenderness: There is no abdominal tenderness. There is no guarding or rebound.      Hernia: There is no hernia in the left inguinal area or right inguinal area.   Genitourinary:     Penis: Circumcised. No hypospadias or tenderness.       Testes:         Right: Mass or tenderness not present.         Left: Mass or tenderness not present.   Musculoskeletal:         General: No tenderness. Normal range of motion.      Cervical back: Normal range of motion and neck supple.      Right lower leg: No edema.      Left lower leg: No edema.   Lymphadenopathy:  Cervical: No cervical adenopathy.      Upper Body:      Right upper body: No supraclavicular, axillary or epitrochlear adenopathy.      Left upper body: No supraclavicular, axillary or epitrochlear adenopathy.      Lower Body: No right inguinal adenopathy. No left inguinal adenopathy.   Skin:     General: Skin is warm.      Findings: No rash.   Neurological:      Mental Status: He is alert and oriented to person, place, and time.      Cranial Nerves: No cranial nerve deficit.      Motor: No abnormal muscle tone.       Coordination: Coordination normal.      Deep Tendon Reflexes: Reflexes are normal and symmetric.        Assessment:     1. Annual physical exam    2. Need for Tdap vaccination  - Tdap vaccine greater than or equal to 7yo IM    3. Anxiety  - Ambulatory referral to Psychology    4. Class 3 severe obesity with serious comorbidity and body mass index (BMI) of 45.0 to 49.9 in adult, unspecified obesity type    5. Excessive daytime sleepiness  - CBC and differential  - Comprehensive metabolic panel    6. Weight gain  - CBC and differential  - Comprehensive metabolic panel    7. Screening cholesterol level  - Lipid panel    8. Diabetes mellitus screening  - Hemoglobin A1C    9. Dry mouth  - Anti-SSA (Ro)(Ena) Antibody  - Anti-SSB (La)(Ena) Antibody    Plan:     Tdap updated  Labs ordered  Referred pt to psych as requested.   Encouraged pt to see bariatrics, podiatrist as referred.   Regular exercise, diet restriction, weight reduction  Further recommendations pending test results.  F/u 6 months or sooner prn    Oneita Jolly, MD

## 2021-05-27 NOTE — Progress Notes (Signed)
Have you seen any specialists/other providers since your last visit with [place]?    No    Arm preference verified?   Yes    The patient is due for  covid-19 booster, tetanus ten year

## 2021-05-29 ENCOUNTER — Other Ambulatory Visit (INDEPENDENT_AMBULATORY_CARE_PROVIDER_SITE_OTHER): Payer: Self-pay | Admitting: Internal Medicine

## 2021-05-29 ENCOUNTER — Encounter (INDEPENDENT_AMBULATORY_CARE_PROVIDER_SITE_OTHER): Payer: Self-pay | Admitting: Internal Medicine

## 2021-05-29 DIAGNOSIS — R7989 Other specified abnormal findings of blood chemistry: Secondary | ICD-10-CM

## 2021-05-29 LAB — ANTI-SSA (RO)(ENA) ANTIBODY
Sjogrens SSA (RO) Antibody Interpretation: NEGATIVE
Sjogrens SSA (Ro) Antibody: 4 U/mL (ref 0–19)

## 2021-05-29 LAB — ANTI-SSB (LA)(ENA) ANTIBODY
Sjogrens SSB (La) Antibody Interpretation: NEGATIVE
Sjogrens SSB (La) Antibody: 4 U/mL (ref 0–19)

## 2021-05-29 NOTE — Progress Notes (Signed)
Glucose/Hba1c are borderline high  Liver function tests are mildly abnormal  Cholesterol profile LDL is mildly high, rest of profile is good  Rest of labs are normal    Rec:  1. Do additional labs and obtain a liver sonogram for further evaluation  2. Further recommendations pending test results.     Pt to come in for additional lab draw  Sonogram order sent to MyChart

## 2021-05-31 ENCOUNTER — Other Ambulatory Visit (FREE_STANDING_LABORATORY_FACILITY): Payer: Commercial Managed Care - POS

## 2021-05-31 DIAGNOSIS — R7989 Other specified abnormal findings of blood chemistry: Secondary | ICD-10-CM

## 2021-05-31 LAB — CERULOPLASMIN: Ceruloplasmin: 23 mg/dL (ref 20–60)

## 2021-05-31 LAB — HEPATITIS B SURFACE ANTIGEN W/ REFLEX TO CONFIRMATION: Hepatitis B Surface Antigen: NONREACTIVE

## 2021-05-31 LAB — HEPATITIS C ANTIBODY: Hepatitis C, AB: NONREACTIVE

## 2021-05-31 LAB — ALPHA-1-ANTITRYPSIN: Alpha 1-Antitrypsin: 117.2 mg/dL (ref 84.0–200.0)

## 2021-05-31 LAB — GGT: GGT: 54 U/L (ref 11–88)

## 2021-05-31 LAB — FERRITIN: Ferritin: 207 ng/mL (ref 21.80–274.70)

## 2021-06-03 LAB — MITOCHONDRIAL M2 ANTIBODY: Mitochondria M2 Antibody, IgG: <0.1 U

## 2021-06-03 LAB — ANA IFA W/REFLEX TO TITER/PATTERN/AB: ANA Screen: NEGATIVE

## 2021-06-03 NOTE — Progress Notes (Signed)
Additional labs for liver are normal  Awaiting liver sonogram results    Rec:  1. Low carb, low chol diet, exercise, weight reduction 2. Continue meds  3. Keep follow up visit

## 2021-06-17 ENCOUNTER — Other Ambulatory Visit (INDEPENDENT_AMBULATORY_CARE_PROVIDER_SITE_OTHER): Payer: Self-pay | Admitting: Internal Medicine

## 2021-06-17 DIAGNOSIS — R7989 Other specified abnormal findings of blood chemistry: Secondary | ICD-10-CM

## 2021-06-17 NOTE — Progress Notes (Signed)
Sonogram of liver shows fatty and enlarged liver.     Rec:  1. Weight reduction  2. See bariatrics service for weight management as referred  3.  Keep follow up visit

## 2021-09-26 ENCOUNTER — Telehealth (INDEPENDENT_AMBULATORY_CARE_PROVIDER_SITE_OTHER): Payer: Self-pay | Admitting: Internal Medicine

## 2021-09-26 ENCOUNTER — Encounter (INDEPENDENT_AMBULATORY_CARE_PROVIDER_SITE_OTHER): Payer: Self-pay | Admitting: Internal Medicine

## 2021-09-26 NOTE — Telephone Encounter (Signed)
Patient wife called today, call center asking update on forms she dropped on 2 days ago at the front desk. Assessment forms. I did let them know it can take up to 5 business days.

## 2021-09-26 NOTE — Telephone Encounter (Signed)
Form was completed.  It was picked up from my outbox already.

## 2021-09-27 ENCOUNTER — Telehealth (INDEPENDENT_AMBULATORY_CARE_PROVIDER_SITE_OTHER): Payer: Self-pay | Admitting: Internal Medicine

## 2021-09-27 ENCOUNTER — Encounter (INDEPENDENT_AMBULATORY_CARE_PROVIDER_SITE_OTHER): Payer: Self-pay

## 2021-09-27 ENCOUNTER — Encounter (INDEPENDENT_AMBULATORY_CARE_PROVIDER_SITE_OTHER): Payer: Self-pay | Admitting: Internal Medicine

## 2021-09-27 NOTE — Telephone Encounter (Signed)
Patient is calling requesting that the paperwork he was called about be upladed to his MyChart.    Called FD, no answer.    Patient can be reached at: [phone]     Thanks!

## 2021-10-15 ENCOUNTER — Telehealth (INDEPENDENT_AMBULATORY_CARE_PROVIDER_SITE_OTHER): Payer: Self-pay | Admitting: Internal Medicine

## 2021-10-15 NOTE — Telephone Encounter (Signed)
Pt has been sending messages since 09/26/21 requesting malaria prevention pills since he will be traveling please advise.

## 2021-10-16 ENCOUNTER — Encounter (INDEPENDENT_AMBULATORY_CARE_PROVIDER_SITE_OTHER): Payer: Self-pay | Admitting: Internal Medicine

## 2021-10-16 ENCOUNTER — Telehealth (INDEPENDENT_AMBULATORY_CARE_PROVIDER_SITE_OTHER): Payer: Self-pay | Admitting: Internal Medicine

## 2021-10-16 ENCOUNTER — Other Ambulatory Visit (INDEPENDENT_AMBULATORY_CARE_PROVIDER_SITE_OTHER): Payer: Self-pay | Admitting: Internal Medicine

## 2021-10-16 DIAGNOSIS — Z298 Encounter for other specified prophylactic measures: Secondary | ICD-10-CM

## 2021-10-16 DIAGNOSIS — Z2989 Encounter for other specified prophylactic measures: Secondary | ICD-10-CM

## 2021-10-16 MED ORDER — MEFLOQUINE HCL 250 MG PO TABS
250.0000 mg | ORAL_TABLET | ORAL | 0 refills | Status: DC
Start: 2021-10-16 — End: 2022-05-28

## 2021-10-16 NOTE — Telephone Encounter (Signed)
Good morning can you assist me with this please     Thank you

## 2021-10-16 NOTE — Telephone Encounter (Signed)
Patient is following up on the malaria pills that they requested about a month ago, and have yet to receive a response from anyone in the office. Please advise and call as soon as possible.     Ph#: [phone]

## 2021-10-16 NOTE — Telephone Encounter (Signed)
Rx mefloquine for malaria prophylaxis has been sent to his [facility] pharmacy as requested.   Please let pt know.

## 2021-10-23 ENCOUNTER — Telehealth (INDEPENDENT_AMBULATORY_CARE_PROVIDER_SITE_OTHER): Payer: Self-pay | Admitting: Internal Medicine

## 2021-10-23 NOTE — Telephone Encounter (Signed)
Patient will like to request rx: 18 Malaria prevention pill for time frame 12/22-12/31.    Please advise message dated 12/07 looks like it was sent to the nurse pool. I did let patient know that you were out of the office last week.     Please advise    Thank you

## 2021-10-23 NOTE — Telephone Encounter (Signed)
Please let pt know rx was sent to pharmacy on 10/16/21.

## 2021-10-24 NOTE — Telephone Encounter (Signed)
Called and lvm notifying patient     Thank you

## 2022-05-20 ENCOUNTER — Ambulatory Visit (INDEPENDENT_AMBULATORY_CARE_PROVIDER_SITE_OTHER): Payer: Commercial Managed Care - PPO | Admitting: Family Medicine

## 2022-05-28 ENCOUNTER — Ambulatory Visit (INDEPENDENT_AMBULATORY_CARE_PROVIDER_SITE_OTHER): Payer: Commercial Managed Care - PPO | Admitting: Internal Medicine

## 2022-05-28 ENCOUNTER — Encounter (INDEPENDENT_AMBULATORY_CARE_PROVIDER_SITE_OTHER): Payer: Self-pay | Admitting: Internal Medicine

## 2022-05-28 VITALS — BP 138/89 | HR 71 | Temp 98.4°F | Ht 74.0 in | Wt 350.0 lb

## 2022-05-28 DIAGNOSIS — Z0000 Encounter for general adult medical examination without abnormal findings: Secondary | ICD-10-CM

## 2022-05-28 DIAGNOSIS — E785 Hyperlipidemia, unspecified: Secondary | ICD-10-CM

## 2022-05-28 DIAGNOSIS — R0683 Snoring: Secondary | ICD-10-CM

## 2022-05-28 DIAGNOSIS — K760 Fatty (change of) liver, not elsewhere classified: Secondary | ICD-10-CM

## 2022-05-28 DIAGNOSIS — R635 Abnormal weight gain: Secondary | ICD-10-CM

## 2022-05-28 DIAGNOSIS — Z0001 Encounter for general adult medical examination with abnormal findings: Secondary | ICD-10-CM

## 2022-05-28 DIAGNOSIS — R7303 Prediabetes: Secondary | ICD-10-CM

## 2022-05-28 DIAGNOSIS — K625 Hemorrhage of anus and rectum: Secondary | ICD-10-CM

## 2022-05-28 DIAGNOSIS — R400 Somnolence: Secondary | ICD-10-CM

## 2022-05-28 DIAGNOSIS — Z6841 Body Mass Index (BMI) 40.0 and over, adult: Secondary | ICD-10-CM

## 2022-05-28 LAB — COMPREHENSIVE METABOLIC PANEL
ALT: 74 U/L — ABNORMAL HIGH (ref 0–55)
AST (SGOT): 35 U/L (ref 5–41)
Albumin/Globulin Ratio: 1.4 (ref 0.9–2.2)
Albumin: 4.3 g/dL (ref 3.5–5.0)
Alkaline Phosphatase: 98 U/L (ref 37–117)
Anion Gap: 6 (ref 5.0–15.0)
BUN: 15 mg/dL (ref 9.0–28.0)
Bilirubin, Total: 0.5 mg/dL (ref 0.2–1.2)
CO2: 27 mEq/L (ref 17–29)
Calcium: 9.5 mg/dL (ref 8.5–10.5)
Chloride: 105 mEq/L (ref 99–111)
Creatinine: 1 mg/dL (ref 0.5–1.5)
Globulin: 3.1 g/dL (ref 2.0–3.6)
Glucose: 102 mg/dL — ABNORMAL HIGH (ref 70–100)
Potassium: 4.4 mEq/L (ref 3.5–5.3)
Protein, Total: 7.4 g/dL (ref 6.0–8.3)
Sodium: 138 mEq/L (ref 135–145)
eGFR: >60 mL/min/{1.73_m2} (ref >=60)

## 2022-05-28 LAB — URINALYSIS WITH MICROSCOPIC
Bilirubin, UA: NEGATIVE
Glucose, UA: NEGATIVE
Ketones UA: NEGATIVE
Nitrite, UA: NEGATIVE
Specific Gravity UA: 1.029 (ref 1.001–1.035)
Urine pH: 6 (ref 5.0–8.0)
Urobilinogen, UA: NORMAL mg/dL

## 2022-05-28 LAB — HEMOGLOBIN A1C
Average Estimated Glucose: 128.4 mg/dL
Hemoglobin A1C: 6.1 % — ABNORMAL HIGH (ref 4.6–5.6)

## 2022-05-28 LAB — CBC AND DIFFERENTIAL
Absolute NRBC: 0 10*3/uL (ref 0.00–0.00)
Basophils Absolute Automated: 0.04 10*3/uL (ref 0.00–0.08)
Basophils Automated: 0.5 %
Eosinophils Absolute Automated: 0.13 10*3/uL (ref 0.00–0.44)
Eosinophils Automated: 1.6 %
Hematocrit: 46.9 % (ref 37.6–49.6)
Hgb: 14.6 g/dL (ref 12.5–17.1)
Immature Granulocytes Absolute: 0.02 10*3/uL (ref 0.00–0.07)
Immature Granulocytes: 0.2 %
Instrument Absolute Neutrophil Count: 3.01 10*3/uL (ref 1.10–6.33)
Lymphocytes Absolute Automated: 4.09 10*3/uL — ABNORMAL HIGH (ref 0.42–3.22)
Lymphocytes Automated: 49.3 %
MCH: 28.1 pg (ref 25.1–33.5)
MCHC: 31.1 g/dL — ABNORMAL LOW (ref 31.5–35.8)
MCV: 90.2 fL (ref 78.0–96.0)
MPV: 9.5 fL (ref 8.9–12.5)
Monocytes Absolute Automated: 1 10*3/uL — ABNORMAL HIGH (ref 0.21–0.85)
Monocytes: 12.1 %
Neutrophils Absolute: 3.01 10*3/uL (ref 1.10–6.33)
Neutrophils: 36.3 %
Nucleated RBC: 0 /100 WBC (ref 0.0–0.0)
Platelets: 325 10*3/uL (ref 142–346)
RBC: 5.2 10*6/uL (ref 4.20–5.90)
RDW: 12 % (ref 11–15)
WBC: 8.29 10*3/uL (ref 3.10–9.50)

## 2022-05-28 LAB — LIPID PANEL
Cholesterol / HDL Ratio: 3.7 Index
Cholesterol: 149 mg/dL (ref 0–199)
HDL: 40 mg/dL (ref 40–9999)
LDL Calculated: 96 mg/dL (ref 0–99)
Triglycerides: 67 mg/dL (ref 34–149)
VLDL Calculated: 13 mg/dL (ref 10–40)

## 2022-05-28 LAB — HEMOLYSIS INDEX: Hemolysis Index: 11 Index (ref 0–24)

## 2022-05-28 LAB — TSH: TSH: 3.32 u[IU]/mL (ref 0.35–4.94)

## 2022-05-28 NOTE — Progress Notes (Signed)
Have you seen any specialists/other providers since your last visit with [place]?    No    Health Maintenance Due   Topic Date Due    COVID-19 Vaccine (2 - Booster for Janssen series) 07/20/2020

## 2022-05-28 NOTE — Progress Notes (Signed)
Subjective:      Patient ID: Joseph Edwards is a 31 y.o. male.    Chief Complaint:  Chief Complaint   Patient presents with    Annual Exam     Pt is fasting        HPI:  HPI  pt is here for annual physical exam    Exercise, limited,   Diet, regular 2 meals/day    Fatty liver with abnormal LFT's,   Weight up 10lbs since last year      Problem List:  Patient Active Problem List   Diagnosis    Chronic idiopathic constipation       Current Medications:  Current Outpatient Medications   Medication Sig Dispense Refill    mefloquine (LARIAM) 250 MG tablet Take 1 tablet (250 mg) by mouth once a week Begin 2 weeks before travel, weekly while in endemic area and for 4 weeks after return. 7 tablet 0     No current facility-administered medications for this visit.       Allergies:  No Known Allergies    Past Medical History:  History reviewed. No pertinent past medical history.    Past Surgical History:  History reviewed. No pertinent surgical history.    Family History:  Family History   Problem Relation Age of Onset    Diabetes Mother     No known problems Father        Social History:  Social History     Socioeconomic History    Marital status: Married    Number of children: 0   Occupational History    Occupation: Marketing executive company   Tobacco Use    Smoking status: Never    Smokeless tobacco: Never   Vaping Use    Vaping Use: Never used   Substance and Sexual Activity    Alcohol use: Yes     Comment: socially    Drug use: Not Currently    Sexual activity: Yes     Partners: Female     Social Determinants of Health     Financial Resource Strain: Low Risk  (05/28/2022)    Overall Financial Resource Strain (CARDIA)     Difficulty of Paying Living Expenses: Not hard at all   Food Insecurity: No Food Insecurity (05/28/2022)    Hunger Vital Sign     Worried About Running Out of Food in the Last Year: Never true     Ran Out of Food in the Last Year: Never true   Transportation Needs: No Transportation Needs (05/28/2022)    PRAPARE -  Therapist, art (Medical): No     Lack of Transportation (Non-Medical): No   Physical Activity: Inactive (05/28/2022)    Exercise Vital Sign     Days of Exercise per Week: 0 days     Minutes of Exercise per Session: 10 min   Stress: No Stress Concern Present (05/28/2022)    [employer] of Occupational Health - Occupational Stress Questionnaire     Feeling of Stress : Not at all   Social Connections: Moderately Integrated (05/28/2022)    Social Connection and Isolation Panel [NHANES]     Frequency of Communication with Friends and Family: More than three times a week     Frequency of Social Gatherings with Friends and Family: More than three times a week     Attends Religious Services: More than 4 times per year     Active Member of [employer] or Organizations:  No     Attends Club or Organization Meetings: Never     Marital Status: Married   Catering manager Violence: Not At Risk (05/28/2022)    Humiliation, Afraid, Rape, and Kick questionnaire     Fear of Current or Ex-Partner: No     Emotionally Abused: No     Physically Abused: No     Sexually Abused: No   Housing Stability: Low Risk  (05/28/2022)    Housing Stability Vital Sign     Unable to Pay for Housing in the Last Year: No     Number of Places Lived in the Last Year: 1     Unstable Housing in the Last Year: No        The following sections were reviewed this encounter by the provider:   Tobacco  Allergies  Meds  Problems  Med Hx  Surg Hx  Fam Hx  Soc Hx          ROS:  Review of Systems   Constitutional:  Positive for unexpected weight change. Negative for appetite change, chills and fever.   HENT:  Negative for hearing loss.    Eyes:  Negative for visual disturbance.        +glasses, vision good, last eye exam 2022   Respiratory:  Negative for cough, shortness of breath and wheezing.    Cardiovascular:  Negative for chest pain and palpitations.   Gastrointestinal:  Positive for blood in stool. Negative for abdominal pain,  constipation, diarrhea, nausea and vomiting.        ?hemorrhoids   Genitourinary:  Negative for dysuria, frequency, hematuria and urgency.   Musculoskeletal:  Negative for arthralgias and joint swelling.   Skin:  Negative for rash.   Neurological:  Negative for syncope, weakness, light-headedness and numbness.   Hematological:  Negative for adenopathy.   Psychiatric/Behavioral:  Negative for dysphoric mood and sleep disturbance. The patient is not nervous/anxious.         +snoring, daytime sleepiness.        Vitals:  BP 138/89 (BP Site: Right arm, Patient Position: Sitting)   Pulse 71   Temp 98.4 F (36.9 C) (Temporal)   Ht 1.88 m (6\' 2" )   Wt 158.8 kg (350 lb)   BMI 44.94 kg/m      Objective:     Physical Exam:  Physical Exam  Constitutional:       General: He is not in acute distress.     Appearance: He is well-developed. He is obese.   HENT:      Head: Normocephalic and atraumatic.      Right Ear: External ear normal.      Left Ear: External ear normal.      Mouth/Throat:      Pharynx: No oropharyngeal exudate.   Eyes:      General: No scleral icterus.     Conjunctiva/sclera: Conjunctivae normal.      Pupils: Pupils are equal, round, and reactive to light.   Neck:      Thyroid: No thyromegaly.      Vascular: No carotid bruit or JVD.   Cardiovascular:      Rate and Rhythm: Normal rate and regular rhythm.      Heart sounds: Normal heart sounds. No murmur heard.     No friction rub. No gallop.   Pulmonary:      Effort: Pulmonary effort is normal. No respiratory distress.      Breath sounds: Normal breath sounds. No wheezing  or rales.   Abdominal:      General: Bowel sounds are normal. There is no distension.      Palpations: Abdomen is soft. There is no mass.      Tenderness: There is no abdominal tenderness.      Hernia: There is no hernia in the left inguinal area or right inguinal area.   Genitourinary:     Penis: Circumcised. No hypospadias or tenderness.       Testes:         Right: Mass or tenderness  not present.         Left: Mass or tenderness not present.      Rectum: No external hemorrhoid.   Musculoskeletal:         General: No tenderness. Normal range of motion.      Cervical back: Normal range of motion and neck supple.   Lymphadenopathy:      Cervical: No cervical adenopathy.      Upper Body:      Right upper body: No supraclavicular, axillary or epitrochlear adenopathy.      Left upper body: No supraclavicular, axillary or epitrochlear adenopathy.      Lower Body: No right inguinal adenopathy. No left inguinal adenopathy.   Skin:     General: Skin is warm.      Findings: No rash.   Neurological:      Mental Status: He is alert and oriented to person, place, and time.      Cranial Nerves: No cranial nerve deficit.      Motor: No abnormal muscle tone.      Coordination: Coordination normal.      Deep Tendon Reflexes: Reflexes are normal and symmetric.          Assessment:     1. Annual physical exam  - CBC and differential  - Comprehensive metabolic panel  - Urinalysis with microscopic    2. Anal bleeding  - Referral to Gastroenterology ([facility]); Future  - Stool Occult Blood, Immunoassay, (Not Guaiac Based); Future    3. Fatty liver    4. Pre-diabetes  - Hemoglobin A1C    5. Weight gain  - TSH    6. Class 3 severe obesity with serious comorbidity and body mass index (BMI) of 45.0 to 49.9 in adult, unspecified obesity type  - Bariatric Surgery Referral: Marshia Ly, FNP-BC [facility]); Future  - TSH    7. Hyperlipidemia, unspecified hyperlipidemia type  - Lipid panel    8. Snoring  - Ambulatory referral to Pulmonology; Future    9. Daytime sleepiness  - Ambulatory referral to Pulmonology; Future      Plan:     Labs ordered  Low carb, low chol, low calorie diet, exercise, weight reduction   Referred pt to bariatrics for weight management  Referred pt to pulm/sleep medicine for sleep evaluation   Referred pt to GI for evaluation , FIT test ordered  Further recommendations pending test results.  F/u 6  months or sooner prn    Excell Seltzer, MD

## 2022-05-29 ENCOUNTER — Encounter (INDEPENDENT_AMBULATORY_CARE_PROVIDER_SITE_OTHER): Payer: Self-pay

## 2022-05-29 NOTE — Progress Notes (Signed)
Glucose/Hba1c are borderline high, consistent with pre-diabetes  Liver function tests are mildly abnormal  Thyroid level is normal  Cholesterol profile is good  Rest of labs are normal    Rec:  1. Low carb, low cholesterol, low calorie diet, exercise, weight reduction  2. Keep follow up visit

## 2022-12-05 ENCOUNTER — Ambulatory Visit (INDEPENDENT_AMBULATORY_CARE_PROVIDER_SITE_OTHER): Payer: Commercial Managed Care - PPO | Admitting: Internal Medicine

## 2023-08-28 ENCOUNTER — Ambulatory Visit (INDEPENDENT_AMBULATORY_CARE_PROVIDER_SITE_OTHER): Payer: Commercial Managed Care - PPO | Admitting: Geriatric Medicine
